# Patient Record
Sex: Female | Born: 1970 | Race: White | Hispanic: No | Marital: Single | Smoking: Never smoker
Health system: Southern US, Community
[De-identification: ages and names within clinical notes are randomized; demographics above are authoritative.]

## PROBLEM LIST (undated history)

## (undated) HISTORY — PX: ANKLE SURGERY: SHX546

## (undated) HISTORY — PX: FOOT SURGERY: SHX648

## (undated) HISTORY — PX: TUBAL LIGATION: SHX77

## (undated) HISTORY — PX: KNEE SURGERY: SHX244

---

## 2005-07-31 ENCOUNTER — Other Ambulatory Visit: Admission: RE | Admit: 2005-07-31 | Discharge: 2005-07-31 | Payer: Self-pay | Admitting: *Deleted

## 2008-01-10 ENCOUNTER — Encounter: Admission: RE | Admit: 2008-01-10 | Discharge: 2008-01-10 | Payer: Self-pay | Admitting: Obstetrics and Gynecology

## 2012-03-30 ENCOUNTER — Other Ambulatory Visit: Payer: Self-pay | Admitting: Obstetrics and Gynecology

## 2012-04-09 ENCOUNTER — Other Ambulatory Visit: Payer: Self-pay

## 2012-04-12 ENCOUNTER — Ambulatory Visit
Admission: RE | Admit: 2012-04-12 | Discharge: 2012-04-12 | Disposition: A | Discharge status: Home / Self Care | Payer: BC Managed Care – PPO | Source: Ambulatory Visit | Attending: Obstetrics and Gynecology | Admitting: Obstetrics and Gynecology

## 2012-04-12 DIAGNOSIS — R928 Other abnormal and inconclusive findings on diagnostic imaging of breast: Secondary | ICD-10-CM

## 2013-04-20 ENCOUNTER — Emergency Department (HOSPITAL_COMMUNITY): Payer: 59

## 2013-04-20 ENCOUNTER — Encounter (HOSPITAL_COMMUNITY): Payer: Self-pay | Admitting: Emergency Medicine

## 2013-04-20 ENCOUNTER — Emergency Department (HOSPITAL_COMMUNITY)
Admission: EM | Admit: 2013-04-20 | Discharge: 2013-04-20 | Disposition: A | Discharge status: Home / Self Care | Payer: 59 | Attending: Emergency Medicine | Admitting: Emergency Medicine

## 2013-04-20 DIAGNOSIS — R109 Unspecified abdominal pain: Secondary | ICD-10-CM | POA: Insufficient documentation

## 2013-04-20 DIAGNOSIS — Z791 Long term (current) use of non-steroidal anti-inflammatories (NSAID): Secondary | ICD-10-CM | POA: Insufficient documentation

## 2013-04-20 DIAGNOSIS — R110 Nausea: Secondary | ICD-10-CM | POA: Insufficient documentation

## 2013-04-20 DIAGNOSIS — Z3202 Encounter for pregnancy test, result negative: Secondary | ICD-10-CM | POA: Insufficient documentation

## 2013-04-20 DIAGNOSIS — R630 Anorexia: Secondary | ICD-10-CM | POA: Insufficient documentation

## 2013-04-20 LAB — CBC WITH DIFFERENTIAL/PLATELET
BASOS PCT: 1 % (ref 0–1)
Basophils Absolute: 0 10*3/uL (ref 0.0–0.1)
Eosinophils Absolute: 0.2 10*3/uL (ref 0.0–0.7)
Eosinophils Relative: 3 % (ref 0–5)
HEMATOCRIT: 42.3 % (ref 36.0–46.0)
HEMOGLOBIN: 14.7 g/dL (ref 12.0–15.0)
LYMPHS ABS: 3 10*3/uL (ref 0.7–4.0)
LYMPHS PCT: 38 % (ref 12–46)
MCH: 30.7 pg (ref 26.0–34.0)
MCHC: 34.8 g/dL (ref 30.0–36.0)
MCV: 88.3 fL (ref 78.0–100.0)
MONO ABS: 0.5 10*3/uL (ref 0.1–1.0)
MONOS PCT: 6 % (ref 3–12)
NEUTROS ABS: 4 10*3/uL (ref 1.7–7.7)
Neutrophils Relative %: 52 % (ref 43–77)
Platelets: 237 10*3/uL (ref 150–400)
RBC: 4.79 MIL/uL (ref 3.87–5.11)
RDW: 13.3 % (ref 11.5–15.5)
WBC: 7.7 10*3/uL (ref 4.0–10.5)

## 2013-04-20 LAB — URINALYSIS, ROUTINE W REFLEX MICROSCOPIC
BILIRUBIN URINE: NEGATIVE
Glucose, UA: NEGATIVE mg/dL
Hgb urine dipstick: NEGATIVE
KETONES UR: NEGATIVE mg/dL
LEUKOCYTES UA: NEGATIVE
NITRITE: NEGATIVE
PROTEIN: NEGATIVE mg/dL
Specific Gravity, Urine: 1.005 (ref 1.005–1.030)
Urobilinogen, UA: 0.2 mg/dL (ref 0.0–1.0)
pH: 6 (ref 5.0–8.0)

## 2013-04-20 LAB — COMPREHENSIVE METABOLIC PANEL
ALK PHOS: 93 U/L (ref 39–117)
ALT: 38 U/L — ABNORMAL HIGH (ref 0–35)
AST: 30 U/L (ref 0–37)
Albumin: 4.5 g/dL (ref 3.5–5.2)
BILIRUBIN TOTAL: 0.3 mg/dL (ref 0.3–1.2)
BUN: 8 mg/dL (ref 6–23)
CHLORIDE: 100 meq/L (ref 96–112)
CO2: 27 meq/L (ref 19–32)
CREATININE: 0.65 mg/dL (ref 0.50–1.10)
Calcium: 10.2 mg/dL (ref 8.4–10.5)
GLUCOSE: 94 mg/dL (ref 70–99)
Potassium: 4.7 mEq/L (ref 3.7–5.3)
Sodium: 141 mEq/L (ref 137–147)
Total Protein: 7.9 g/dL (ref 6.0–8.3)

## 2013-04-20 LAB — POC URINE PREG, ED: Preg Test, Ur: NEGATIVE

## 2013-04-20 LAB — LIPASE, BLOOD: LIPASE: 31 U/L (ref 11–59)

## 2013-04-20 MED ORDER — HYDROCODONE-ACETAMINOPHEN 5-325 MG PO TABS: 1.0000 | ORAL_TABLET | Freq: Four times a day (QID) | ORAL | Status: AC | PRN

## 2013-04-20 MED ORDER — KETOROLAC TROMETHAMINE 60 MG/2ML IM SOLN
60.0000 mg | Freq: Once | INTRAMUSCULAR | Status: AC
  Administered 2013-04-20: 60 mg via INTRAMUSCULAR
  Filled 2013-04-20: qty 2

## 2013-04-20 NOTE — ED Notes (Signed)
Patient given gown to change into upon arrival to room.

## 2013-04-20 NOTE — ED Notes (Signed)
Pt presents to department for evaluation of L sided flank pain radiating to lower back. Also states nausea. 9/10 pain upon arrival. Denies urinary symptoms. Pt is alert and oriented x4.

## 2013-04-20 NOTE — ED Provider Notes (Signed)
CSN: [id number]     Arrival date & time 04/20/13  1622 History   First MD Initiated Contact with Patient 04/20/13 2028     Chief Complaint  Patient presents with  . Flank Pain  . Nausea     (Consider location/radiation/quality/duration/timing/severity/associated sxs/prior Treatment) HPI Comments: Pt driving home from work with sudden sharp L flank pain. No urinary sxs. No hx of stones. No medical problems. Constant, nothing makes better or worse.  Patient is a 43 y.o. female presenting with flank pain. The history is provided by the patient.  Flank Pain This is a new problem. The current episode started yesterday. The problem occurs constantly. The problem has been unchanged. Associated symptoms include abdominal pain, anorexia and nausea. Pertinent negatives include no chest pain, chills, congestion, coughing, diaphoresis, fever, rash or vomiting. Nothing aggravates the symptoms. She has tried nothing for the symptoms. The treatment provided no relief.    History reviewed. No pertinent past medical history. History reviewed. No pertinent past surgical history. No family history on file. History  Substance Use Topics  . Smoking status: Never Smoker   . Smokeless tobacco: Not on file  . Alcohol Use: No   OB History   Grav Para Term Preterm Abortions TAB SAB Ect Mult Living                 Review of Systems  Constitutional: Negative for fever, chills, diaphoresis, activity change and appetite change.  HENT: Negative for congestion and rhinorrhea.   Eyes: Negative for discharge, redness and itching.  Respiratory: Negative for cough, shortness of breath and wheezing.   Cardiovascular: Negative for chest pain and palpitations.  Gastrointestinal: Positive for nausea, abdominal pain and anorexia. Negative for vomiting, diarrhea and constipation.  Genitourinary: Positive for flank pain. Negative for dysuria, hematuria, decreased urine volume and difficulty urinating.  Skin: Negative  for rash.  Neurological: Negative for seizures and syncope.      Allergies  Review of patient's allergies indicates no known allergies.  Home Medications   Current Outpatient Rx  Name  Route  Sig  Dispense  Refill  . naproxen sodium (ANAPROX) 220 MG tablet   Oral   Take 220 mg by mouth 2 (two) times daily with a meal.         . HYDROcodone-acetaminophen (NORCO) 5-325 MG per tablet   Oral   Take 1 tablet by mouth every 6 (six) hours as needed for moderate pain.   15 tablet   0    BP 109/74  Pulse 86  Temp(Src) 97.9 F (36.6 C) (Oral)  Resp 18  Ht 5\' 4"  (1.626 m)  Wt 164 lb (74.39 kg)  BMI 28.14 kg/m2  SpO2 100% Physical Exam  Constitutional: She is oriented to person, place, and time. She appears well-developed and well-nourished. No distress.  HENT:  Head: Normocephalic and atraumatic.  Mouth/Throat: Oropharynx is clear and moist.  Eyes: Conjunctivae and EOM are normal. Pupils are equal, round, and reactive to light. Right eye exhibits no discharge. Left eye exhibits no discharge. No scleral icterus.  Neck: Normal range of motion. Neck supple.  Cardiovascular: Normal rate, regular rhythm and intact distal pulses.  Exam reveals no gallop and no friction rub.   No murmur heard. Pulmonary/Chest: Effort normal and breath sounds normal. No respiratory distress. She has no wheezes. She has no rales.  Abdominal: Soft. She exhibits no distension and no mass. There is tenderness (L CVA TTP, no anterior ttp, soft, no rigidity, neg Mcburneys, neg  Eulah PontMurphy).  Musculoskeletal: Normal range of motion.  Neurological: She is alert and oriented to person, place, and time. No cranial nerve deficit. She exhibits normal muscle tone. Coordination normal.  Skin: She is not diaphoretic.    ED Course  Procedures (including critical care time) Labs Review Labs Reviewed  COMPREHENSIVE METABOLIC PANEL - Abnormal; Notable for the following:    ALT 38 (*)    All other components within  normal limits  CBC WITH DIFFERENTIAL  LIPASE, BLOOD  URINALYSIS, ROUTINE W REFLEX MICROSCOPIC  POC URINE PREG, ED   Imaging Review Ct Abdomen Pelvis Wo Contrast  04/20/2013   CLINICAL DATA:  Left flank pain, left lower quadrant pain  EXAM: CT ABDOMEN AND PELVIS WITHOUT CONTRAST  TECHNIQUE: Multidetector CT imaging of the abdomen and pelvis was performed following the standard protocol without intravenous contrast.  COMPARISON:  None available  FINDINGS: Mild subsegmental atelectasis seen dependently within the lung bases.  Limited noncontrast evaluation of the liver is unremarkable. Gallbladder is within normal limits. No biliary ductal dilatation. The spleen, adrenal glands, and pancreas demonstrate a normal unenhanced appearance.  Kidneys are equal and sinus without evidence of nephrolithiasis or hydronephrosis. No stones seen along the course of either renal collecting system.  No evidence of bowel obstruction. Stomach within normal limits. No abnormal wall thickening or inflammatory fat stranding seen about the bowels. Appendix is well visualized in the right lower quadrant and is of normal caliber and appearance without associated inflammatory changes to suggest acute appendicitis. No significant diverticula.  Bladder is unremarkable.  Uterus and ovaries within normal limits.  No free air or fluid. No enlarged intra-abdominal pelvic lymph nodes minimal atherosclerotic plaque noted about the aortic bifurcation.  No acute osseous abnormality. No worrisome lytic or blastic osseous lesions.  IMPRESSION: 1. No CT evidence of nephrolithiasis or obstructive uropathy identified. 2. No other acute intra-abdominal or pelvic process. No evidence of diverticulitis. Normal appendix.   Electronically Signed   By: Rise MuBenjamin  McClintock M.D.   On: 04/20/2013 21:45     EKG Interpretation None      MDM   MDM:  43 y.o. WF w/ no PMHx w/ ccL: of L flank pain. Yesterday sudden onset in L flank. States radiates to  front. No urinary sxs, no hematuria. Anoxrexia, nausea, no vomtiing or diarrhea. No fever/chills, chest pain, SOB. AFVSS, well appearing here. L CVA TTP, no anterior abd ttp. UA and labs negative. CT Stone study negative. Pt feels better after toradol. Likely low back pain. Recommend NSAIDs, early use of injury. Given rx for Norco. Follow up PCP in 3 days if not better. Discharged. Care of case d/w my attending.  Final diagnoses:  Left flank pain    Discharged   Pilar Jarvisoug Tyvion Edmondson, MD 04/20/13 2239

## 2013-04-20 NOTE — Discharge Instructions (Signed)
Abdominal Pain, Women °Abdominal (stomach, pelvic, or belly) pain can be caused by many things. It is important to tell your doctor: °· The location of the pain. °· Does it come and go or is it present all the time? °· Are there things that start the pain (eating certain foods, exercise)? °· Are there other symptoms associated with the pain (fever, nausea, vomiting, diarrhea)? °All of this is helpful to know when trying to find the cause of the pain. °CAUSES  °· Stomach: virus or bacteria infection, or ulcer. °· Intestine: appendicitis (inflamed appendix), regional ileitis (Crohn's disease), ulcerative colitis (inflamed colon), irritable bowel syndrome, diverticulitis (inflamed diverticulum of the colon), or cancer of the stomach or intestine. °· Gallbladder disease or stones in the gallbladder. °· Kidney disease, kidney stones, or infection. °· Pancreas infection or cancer. °· Fibromyalgia (pain disorder). °· Diseases of the female organs: °· Uterus: fibroid (non-cancerous) tumors or infection. °· Fallopian tubes: infection or tubal pregnancy. °· Ovary: cysts or tumors. °· Pelvic adhesions (scar tissue). °· Endometriosis (uterus lining tissue growing in the pelvis and on the pelvic organs). °· Pelvic congestion syndrome (female organs filling up with blood just before the menstrual period). °· Pain with the menstrual period. °· Pain with ovulation (producing an egg). °· Pain with an IUD (intrauterine device, birth control) in the uterus. °· Cancer of the female organs. °· Functional pain (pain not caused by a disease, may improve without treatment). °· Psychological pain. °· Depression. °DIAGNOSIS  °Your doctor will decide the seriousness of your pain by doing an examination. °· Blood tests. °· X-rays. °· Ultrasound. °· CT scan (computed tomography, special type of X-ray). °· MRI (magnetic resonance imaging). °· Cultures, for infection. °· Barium enema (dye inserted in the large intestine, to better view it with  X-rays). °· Colonoscopy (looking in intestine with a lighted tube). °· Laparoscopy (minor surgery, looking in abdomen with a lighted tube). °· Major abdominal exploratory surgery (looking in abdomen with a large incision). °TREATMENT  °The treatment will depend on the cause of the pain.  °· Many cases can be observed and treated at home. °· Over-the-counter medicines recommended by your caregiver. °· Prescription medicine. °· Antibiotics, for infection. °· Birth control pills, for painful periods or for ovulation pain. °· Hormone treatment, for endometriosis. °· Nerve blocking injections. °· Physical therapy. °· Antidepressants. °· Counseling with a psychologist or psychiatrist. °· Minor or major surgery. °HOME CARE INSTRUCTIONS  °· Do not take laxatives, unless directed by your caregiver. °· Take over-the-counter pain medicine only if ordered by your caregiver. Do not take aspirin because it can cause an upset stomach or bleeding. °· Try a clear liquid diet (broth or water) as ordered by your caregiver. Slowly move to a bland diet, as tolerated, if the pain is related to the stomach or intestine. °· Have a thermometer and take your temperature several times a day, and record it. °· Bed rest and sleep, if it helps the pain. °· Avoid sexual intercourse, if it causes pain. °· Avoid stressful situations. °· Keep your follow-up appointments and tests, as your caregiver orders. °· If the pain does not go away with medicine or surgery, you may try: °· Acupuncture. °· Relaxation exercises (yoga, meditation). °· Group therapy. °· Counseling. °SEEK MEDICAL CARE IF:  °· You notice certain foods cause stomach pain. °· Your home care treatment is not helping your pain. °· You need stronger pain medicine. °· You want your IUD removed. °· You feel faint or   lightheaded. °· You develop nausea and vomiting. °· You develop a rash. °· You are having side effects or an allergy to your medicine. °SEEK IMMEDIATE MEDICAL CARE IF:  °· Your  pain does not go away or gets worse. °· You have a fever. °· Your pain is felt only in portions of the abdomen. The right side could possibly be appendicitis. The left lower portion of the abdomen could be colitis or diverticulitis. °· You are passing blood in your stools (bright red or black tarry stools, with or without vomiting). °· You have blood in your urine. °· You develop chills, with or without a fever. °· You pass out. °MAKE SURE YOU:  °· Understand these instructions. °· Will watch your condition. °· Will get help right away if you are not doing well or get worse. °Document Released: 11/17/2006 Document Revised: 04/14/2011 Document Reviewed: 12/07/2008 °[employer]® Patient Information ©2014 [employer]. ° °

## 2013-04-21 NOTE — ED Provider Notes (Signed)
This patient was seen in conjunction with the resident physician, Dr. Marcha SoldersBrtalik.  The documentation is accurate and reflects the patient's ED course.  On my exam the patient was in no distress.  Evaluation here is largely reassuring.  There is no hemodynamic instability, no suggestion of imminent decompensation.  Patient was discharged in stable condition.  Morgan Munchobert Santosh Petter, MD 04/21/13 [phone]

## 2014-11-24 IMAGING — CT CT ABD-PELV W/O CM
2 of 4 series · 16 of 46 positions shown, 18 images · non-contrast
Comparison: None available

CLINICAL DATA: Left flank pain, left lower quadrant pain

EXAM:
CT ABDOMEN AND PELVIS WITHOUT CONTRAST
TECHNIQUE: Multidetector CT imaging of the abdomen and pelvis was performed
following the standard protocol without intravenous contrast.

[Series 2: stone study 5.0 i30f 1 · axial · 0.71mm/px · z∈[+740,+1175]mm · 13 of 95 slices shown, 15 images]
[im 4/95  soft-tissue]
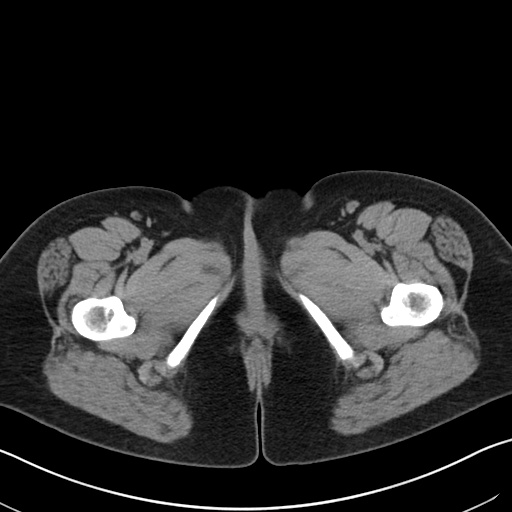
[im 4/95  bone]
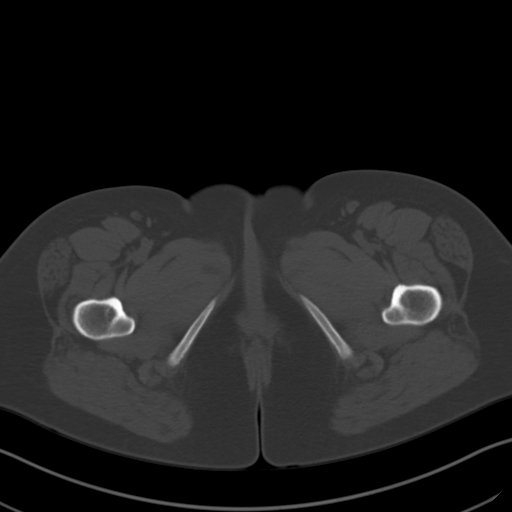
[im 12/95  soft-tissue]
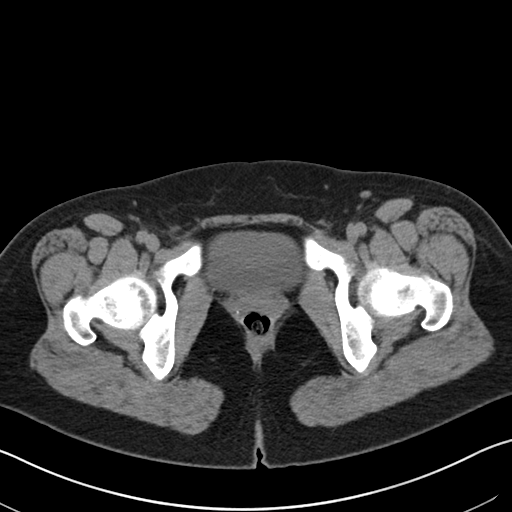
[im 20/95  soft-tissue]
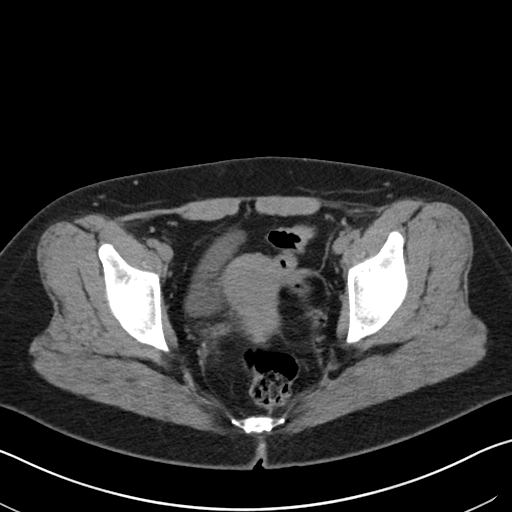
[im 28/95  soft-tissue]
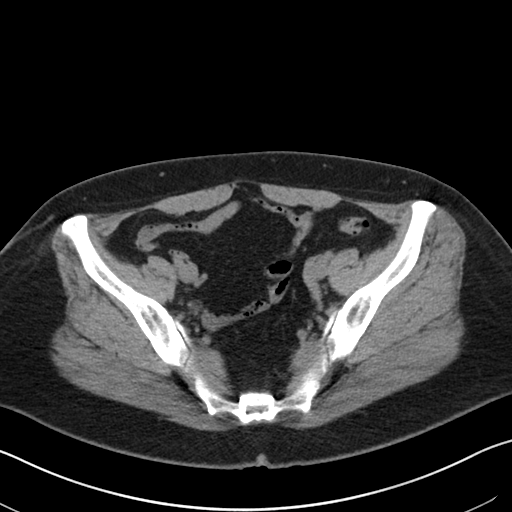
[im 32/95  soft-tissue]
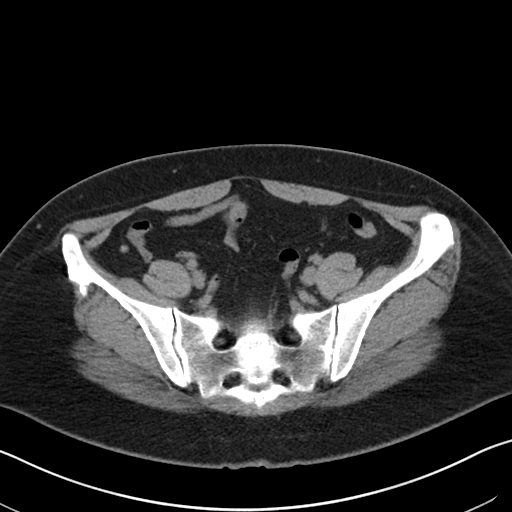
[im 40/95  soft-tissue]
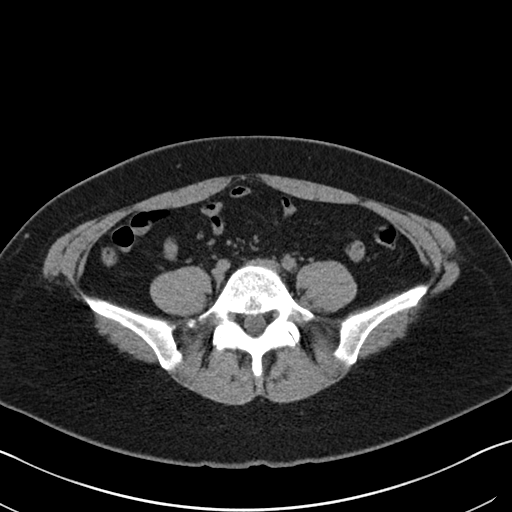
[im 48/95  soft-tissue]
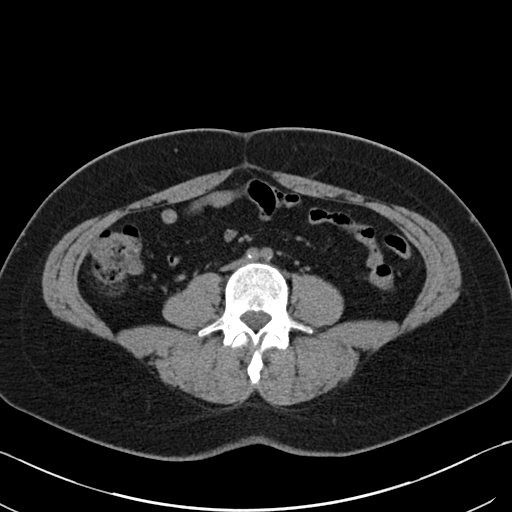
[im 55/95  soft-tissue]
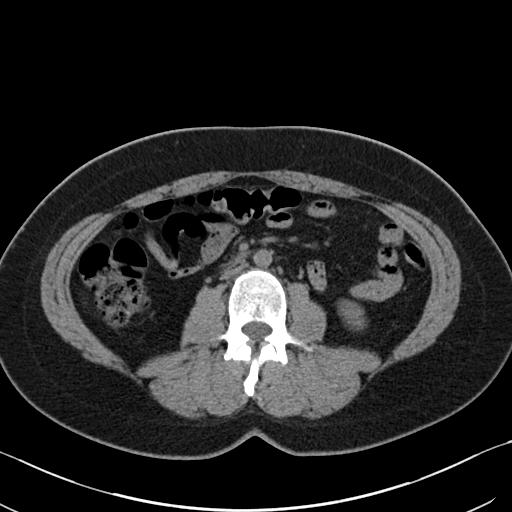
[im 63/95  soft-tissue]
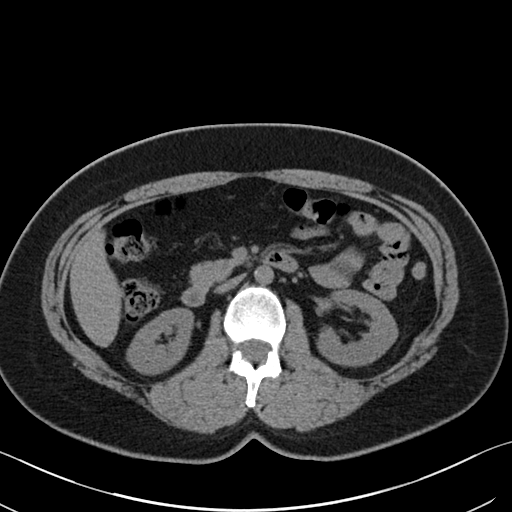
[im 63/95  bone]
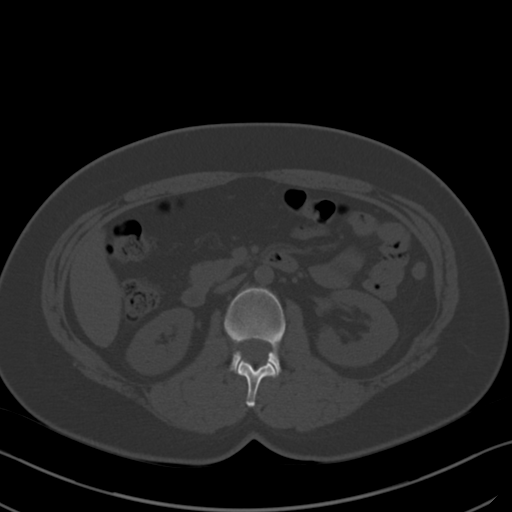
[im 67/95  soft-tissue]
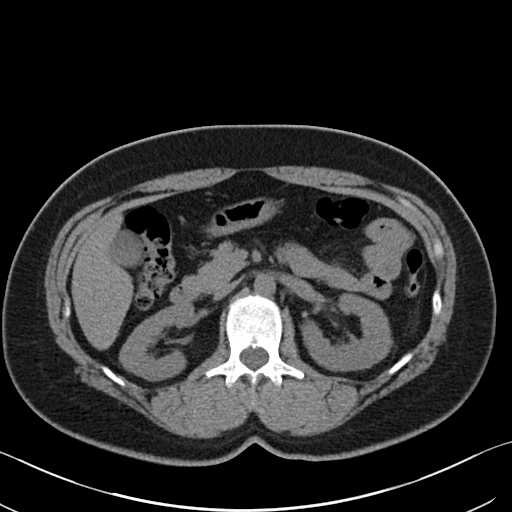
[im 75/95  soft-tissue]
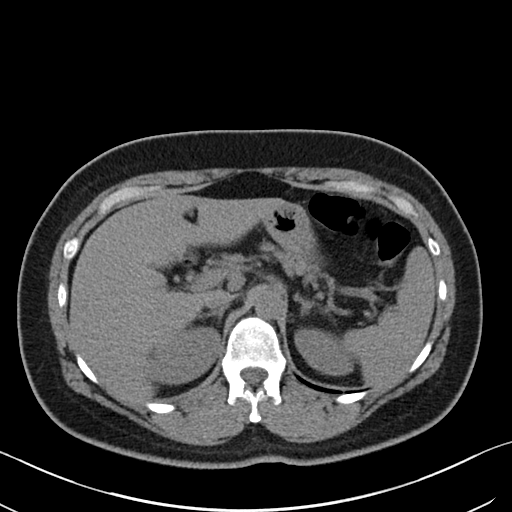
[im 83/95  soft-tissue]
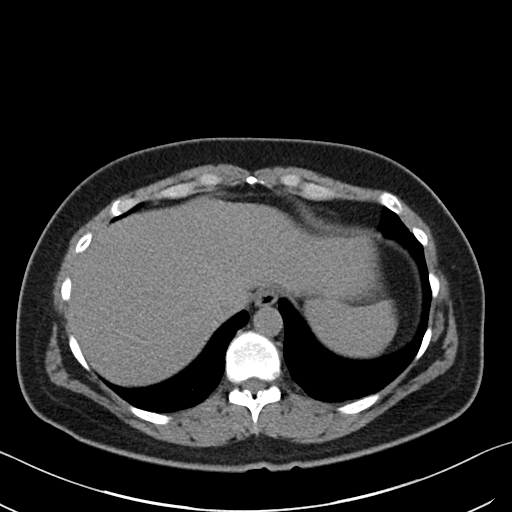
[im 91/95  soft-tissue]
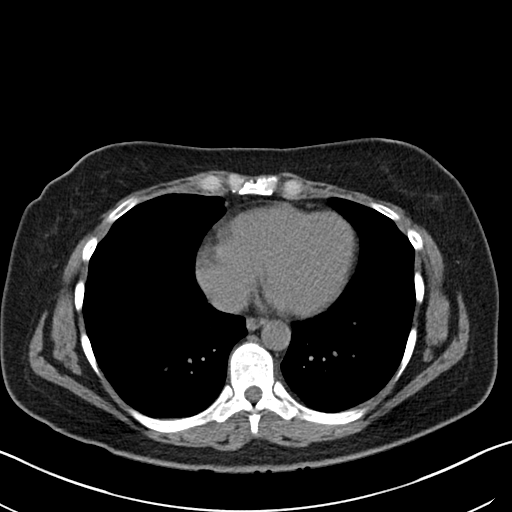

[Series 5: coronal soft tissue · coronal · 0.77mm/px · 3 of 65 slices shown]
[im 22/65  soft-tissue]
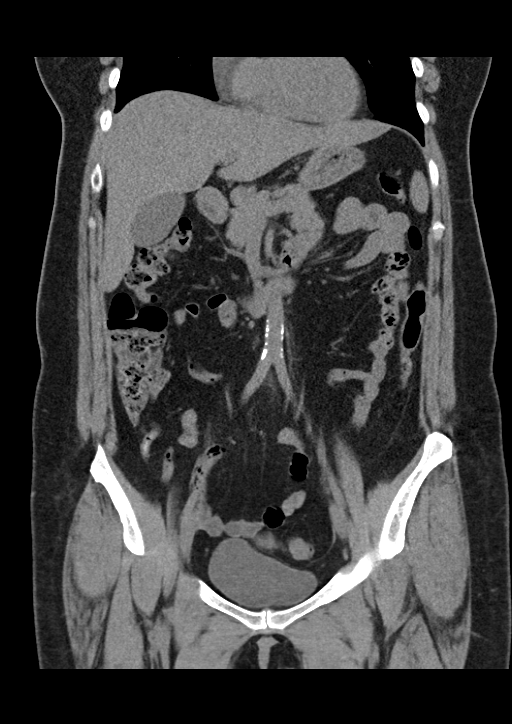
[im 29/65  soft-tissue]
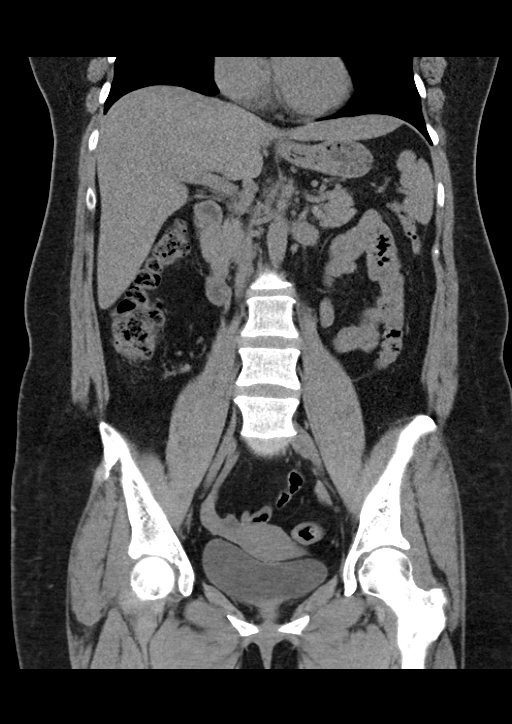
[im 36/65  soft-tissue]
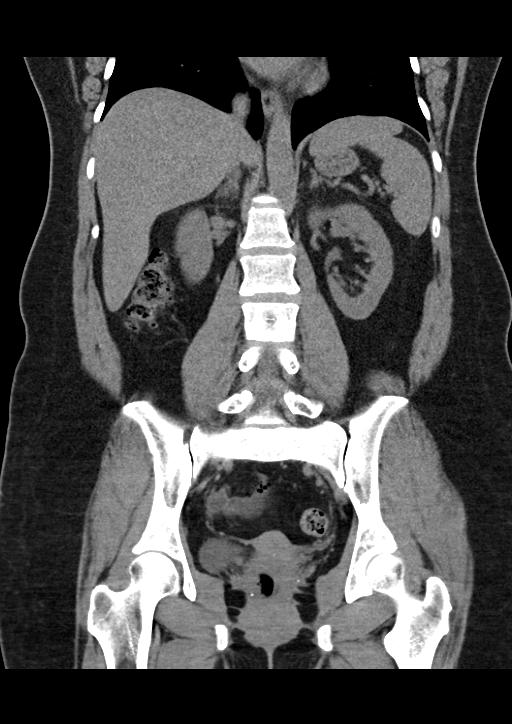

[16 of 46 positions shown; findings below may reference images not displayed]

FINDINGS: Mild subsegmental atelectasis seen dependently within the lung
bases.

Limited noncontrast evaluation of the liver is unremarkable.
Gallbladder is within normal limits. No biliary ductal dilatation.
The spleen, adrenal glands, and pancreas demonstrate a normal
unenhanced appearance.

Kidneys are equal and sinus without evidence of nephrolithiasis or
hydronephrosis. No stones seen along the course of either renal
collecting system.

No evidence of bowel obstruction. Stomach within normal limits. No
abnormal wall thickening or inflammatory fat stranding seen about
the bowels. Appendix is well visualized in the right lower quadrant
and is of normal caliber and appearance without associated
inflammatory changes to suggest acute appendicitis. No significant
diverticula.

Bladder is unremarkable.  Uterus and ovaries within normal limits.

No free air or fluid. No enlarged intra-abdominal pelvic lymph nodes
minimal atherosclerotic plaque noted about the aortic bifurcation.

No acute osseous abnormality. No worrisome lytic or blastic osseous
lesions.
IMPRESSION: 1. No CT evidence of nephrolithiasis or obstructive uropathy
identified.
2. No other acute intra-abdominal or pelvic process. No evidence of
diverticulitis. Normal appendix.

## 2015-05-02 ENCOUNTER — Other Ambulatory Visit: Payer: Self-pay | Admitting: Obstetrics and Gynecology

## 2015-05-02 DIAGNOSIS — N6489 Other specified disorders of breast: Secondary | ICD-10-CM

## 2015-05-08 ENCOUNTER — Ambulatory Visit
Admission: RE | Admit: 2015-05-08 | Discharge: 2015-05-08 | Disposition: A | Discharge status: Home / Self Care | Payer: BLUE CROSS/BLUE SHIELD | Source: Ambulatory Visit | Attending: Obstetrics and Gynecology | Admitting: Obstetrics and Gynecology

## 2015-05-08 DIAGNOSIS — N6489 Other specified disorders of breast: Secondary | ICD-10-CM

## 2015-05-23 ENCOUNTER — Encounter: Payer: Self-pay | Admitting: Podiatry

## 2015-05-23 ENCOUNTER — Ambulatory Visit (INDEPENDENT_AMBULATORY_CARE_PROVIDER_SITE_OTHER): Payer: BLUE CROSS/BLUE SHIELD

## 2015-05-23 ENCOUNTER — Ambulatory Visit (INDEPENDENT_AMBULATORY_CARE_PROVIDER_SITE_OTHER): Payer: BLUE CROSS/BLUE SHIELD | Admitting: Podiatry

## 2015-05-23 VITALS — BP 122/86 | HR 74 | Resp 16

## 2015-05-23 DIAGNOSIS — M21619 Bunion of unspecified foot: Secondary | ICD-10-CM

## 2015-05-23 DIAGNOSIS — M79673 Pain in unspecified foot: Secondary | ICD-10-CM | POA: Diagnosis not present

## 2015-05-23 DIAGNOSIS — M2041 Other hammer toe(s) (acquired), right foot: Secondary | ICD-10-CM

## 2015-05-23 DIAGNOSIS — Q786 Multiple congenital exostoses: Secondary | ICD-10-CM

## 2015-05-23 DIAGNOSIS — M898X0 Other specified disorders of bone, multiple sites: Secondary | ICD-10-CM

## 2015-05-23 MED ORDER — TERBINAFINE HCL 250 MG PO TABS: ORAL_TABLET | ORAL | Status: AC

## 2015-05-23 NOTE — Progress Notes (Signed)
Subjective:     Patient ID: Morgan GanongJennifer Ned, female   DOB: 11-Jan-1971, 45 y.o.   MRN: [id number]  HPI patient presents with painful bunion on the left foot that's been bothering her more extensively over the last 6 months and a painful keratotic lesion between the fourth and fifth toes right that has been bothering her for around a year with lesion also on the outside fifth digit right. States she's tried wider shoes she's tried soaks and padding without relief of symptoms   Review of Systems  All other systems reviewed and are negative.      Objective:   Physical Exam  Constitutional: She is oriented to person, place, and time.  Cardiovascular: Intact distal pulses.   Musculoskeletal: Normal range of motion.  Neurological: She is oriented to person, place, and time.  Skin: Skin is warm.  Nursing note and vitals reviewed.  neurovascular status intact muscle strength adequate range of motion within normal limits with patient found to have hyperostosis medial aspect first metatarsal head left it's painful and redness also around the joint surface. Right fifth toe shows rotation with distal medial exostotic lesion and enlargement had a proximal phalanx. Patient has pain with these areas with keratotic tissue formation and is unable to wear shoe gear without difficulty. 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 Discussed at great length condition and conservative and surgical treatments available. Patient is opted for surgical intervention at this point I have recommended structural bunion correction left and hammertoe repair fifth right along with distal medial exostectomy. She wants to review consent form today she wants to get it done fairly soon and I allowed patient to read consent form reviewing all  possible alternative treatments and complications. She understands is no guarantee as far success of surgery and understands everything as outlined in the consent form and risk associated with it. I went ahead and dispensed air fracture walker for the left with instructions on getting used to prior to procedure and scheduled for outpatient surgery with patient being encouraged to call with any questions  X-ray report indicates indicates elevation of the 1 2 intermetatarsal angle left of approximately 14 and rotation fifth toe right with distal medial exostotic lesion

## 2015-05-23 NOTE — Progress Notes (Signed)
   Subjective:    Patient ID: Morgan Huang, female    DOB: [birth year], 45 y.o.   MRN: [id number]  HPI    Review of Systems  Constitutional: Positive for diaphoresis and fatigue.  Musculoskeletal: Positive for arthralgias and gait problem.  All other systems reviewed and are negative.      Objective:   Physical Exam        Assessment & Plan:

## 2015-05-23 NOTE — Patient Instructions (Signed)
Pre-Operative Instructions  Congratulations, you have decided to take an important step to improving your quality of life.  You can be assured that the doctors of [facility] will be with you every step of the way.  1. Plan to be at the surgery center/hospital at least 1 (one) hour prior to your scheduled time unless otherwise directed by the surgical center/hospital staff.  You must have a responsible adult accompany you, remain during the surgery and drive you home.  Make sure you have directions to the surgical center/hospital and know how to get there on time. 2. For hospital based surgery you will need to obtain a history and physical form from your family physician within 1 month prior to the date of surgery- we will give you a form for you primary physician.  3. We make every effort to accommodate the date you request for surgery.  There are however, times where surgery dates or times have to be moved.  We will contact you as soon as possible if a change in schedule is required.   4. No Aspirin/Ibuprofen for one week before surgery.  If you are on aspirin, any non-steroidal anti-inflammatory medications (Mobic, Aleve, Ibuprofen) you should stop taking it 7 days prior to your surgery.  You make take Tylenol  For pain prior to surgery.  5. Medications- If you are taking daily heart and blood pressure medications, seizure, reflux, allergy, asthma, anxiety, pain or diabetes medications, make sure the surgery center/hospital is aware before the day of surgery so they may notify you which medications to take or avoid the day of surgery. 6. No food or drink after midnight the night before surgery unless directed otherwise by surgical center/hospital staff. 7. No alcoholic beverages 24 hours prior to surgery.  No smoking 24 hours prior to or 24 hours after surgery. 8. Wear loose pants or shorts- loose enough to fit over bandages, boots, and casts. 9. No slip on shoes, sneakers are best. 10. Bring  your boot with you to the surgery center/hospital.  Also bring crutches or a walker if your physician has prescribed it for you.  If you do not have this equipment, it will be provided for you after surgery. 11. If you have not been contracted by the surgery center/hospital by the day before your surgery, call to confirm the date and time of your surgery. 12. Leave-time from work may vary depending on the type of surgery you have.  Appropriate arrangements should be made prior to surgery with your employer. 13. Prescriptions will be provided immediately following surgery by your doctor.  Have these filled as soon as possible after surgery and take the medication as directed. 14. Remove nail polish on the operative foot. 15. Wash the night before surgery.  The night before surgery wash the foot and leg well with the antibacterial soap provided and water paying special attention to beneath the toenails and in between the toes.  Rinse thoroughly with water and dry well with a towel.  Perform this wash unless told not to do so by your physician.  Enclosed: 1 Ice pack (please put in freezer the night before surgery)   1 Hibiclens skin cleaner   Pre-op Instructions  If you have any questions regarding the instructions, do not hesitate to call our office.  [place]: [address] [place], [place] [address] [phone]  [place]: [address] [place], [place] [address] [phone]  [place]: [address]  [place], [place] [address] [phone]  Dr. Richard   Tuchman DPM, Dr. Norman Regal DPM Dr. Richard Sikora DPM, Dr. M. Todd Hyatt DPM, Dr. Kathryn Egerton DPM 

## 2015-06-05 ENCOUNTER — Encounter: Payer: Self-pay | Admitting: Podiatry

## 2015-06-05 DIAGNOSIS — M2012 Hallux valgus (acquired), left foot: Secondary | ICD-10-CM | POA: Diagnosis not present

## 2015-06-05 DIAGNOSIS — M2041 Other hammer toe(s) (acquired), right foot: Secondary | ICD-10-CM | POA: Diagnosis not present

## 2015-06-05 DIAGNOSIS — M25774 Osteophyte, right foot: Secondary | ICD-10-CM | POA: Diagnosis not present

## 2015-06-06 ENCOUNTER — Telehealth: Payer: Self-pay | Admitting: *Deleted

## 2015-06-06 NOTE — Telephone Encounter (Signed)
Post op courtesy call-Pt states she's doing fine.  I instructed pt not to dangle or be up on the surgical foot more than 15 mins/hour, leave the surgical boot on even to sleep and walk, but may open the and rotate the foot gently if resting, leave the original surgical dressing in place until the 1st POV, take pain medications as directed, and call with concerns. Pt states understanding.

## 2015-06-15 ENCOUNTER — Ambulatory Visit (INDEPENDENT_AMBULATORY_CARE_PROVIDER_SITE_OTHER): Payer: BLUE CROSS/BLUE SHIELD | Admitting: Podiatry

## 2015-06-15 ENCOUNTER — Ambulatory Visit (INDEPENDENT_AMBULATORY_CARE_PROVIDER_SITE_OTHER): Payer: BLUE CROSS/BLUE SHIELD

## 2015-06-15 ENCOUNTER — Encounter: Payer: Self-pay | Admitting: Podiatry

## 2015-06-15 VITALS — Temp 99.2°F

## 2015-06-15 DIAGNOSIS — Z9889 Other specified postprocedural states: Secondary | ICD-10-CM

## 2015-06-15 DIAGNOSIS — M21619 Bunion of unspecified foot: Secondary | ICD-10-CM

## 2015-06-15 DIAGNOSIS — M2041 Other hammer toe(s) (acquired), right foot: Secondary | ICD-10-CM

## 2015-06-15 MED ORDER — OXYCODONE-ACETAMINOPHEN 10-325 MG PO TABS: 1.0000 | ORAL_TABLET | Freq: Four times a day (QID) | ORAL | Status: AC | PRN

## 2015-06-15 NOTE — Progress Notes (Signed)
Subjective:     Patient ID: Morgan GanongJennifer Huang, female   DOB: 15-Feb-1970, 45 y.o.   MRN: [id number]  HPI patient states I'm doing well with my feet with able to walk on them without much discomfort and wearing my boot on the left and the surgical shoe on the right   Review of Systems     Objective:   Physical Exam Neurovascular status intact muscle strength adequate negative Homans sign noted with patient having well structured first MPJ left with hallux in rectus position and good alignment of the fifth digit right with stitches intact    Assessment:     Doing well post surgical intervention of both feet    Plan:     H&P and x-rays reviewed of both feet. Today I went ahead and have recommended continued immobilization elevation and reapplied sterile dressing and patient be seen back 2 weeks for suture removal or earlier if needed  X-ray report indicated osteotomy left is healing very well with good alignment and the digit fifth right had satisfactory section of bone with good alignment

## 2015-06-29 ENCOUNTER — Ambulatory Visit (INDEPENDENT_AMBULATORY_CARE_PROVIDER_SITE_OTHER): Payer: BLUE CROSS/BLUE SHIELD

## 2015-06-29 ENCOUNTER — Encounter: Payer: Self-pay | Admitting: Podiatry

## 2015-06-29 ENCOUNTER — Ambulatory Visit (INDEPENDENT_AMBULATORY_CARE_PROVIDER_SITE_OTHER): Payer: BLUE CROSS/BLUE SHIELD | Admitting: Podiatry

## 2015-06-29 ENCOUNTER — Ambulatory Visit: Payer: BLUE CROSS/BLUE SHIELD

## 2015-06-29 DIAGNOSIS — M2041 Other hammer toe(s) (acquired), right foot: Secondary | ICD-10-CM

## 2015-06-29 DIAGNOSIS — Z9889 Other specified postprocedural states: Secondary | ICD-10-CM

## 2015-06-29 DIAGNOSIS — M21619 Bunion of unspecified foot: Secondary | ICD-10-CM

## 2015-07-01 NOTE — Progress Notes (Signed)
Subjective:     Patient ID: Morgan GanongJennifer Sally, female   DOB: 05-30-1970, 45 y.o.   MRN: [id number]  HPI patient states she's doing well with good structural alignment first MPJ left and good digital repair fifth digit  right   Review of Systems     Objective:   Physical Exam Neurovascular status intact muscle strength adequate range of motion within normal limits with patient found to have well-healing surgical site left and good digital repair with stitches intact fifth right    Assessment:     Doing well post osteotomy left hammertoe repair    Plan:     X-rays reviewed and based on response and allow her to gradual return soft shoe gear. Stitches removed left fifth digit and digits found to be in good alignment and may slowly return to soft shoes  X-ray report indicates pins are in place left with good digital correction fifth right with good alignment

## 2015-07-25 ENCOUNTER — Ambulatory Visit (INDEPENDENT_AMBULATORY_CARE_PROVIDER_SITE_OTHER): Payer: BLUE CROSS/BLUE SHIELD

## 2015-07-25 ENCOUNTER — Ambulatory Visit (INDEPENDENT_AMBULATORY_CARE_PROVIDER_SITE_OTHER): Payer: BLUE CROSS/BLUE SHIELD | Admitting: Podiatry

## 2015-07-25 DIAGNOSIS — M2041 Other hammer toe(s) (acquired), right foot: Secondary | ICD-10-CM | POA: Diagnosis not present

## 2015-07-25 DIAGNOSIS — Z9889 Other specified postprocedural states: Secondary | ICD-10-CM

## 2015-07-25 DIAGNOSIS — M21619 Bunion of unspecified foot: Secondary | ICD-10-CM | POA: Diagnosis not present

## 2015-07-25 NOTE — Progress Notes (Signed)
Subjective:     Patient ID: Morgan GanongJennifer Borrero, female   DOB: [birth year], 45 y.o.   MRN: [id number]  HPI patient states I'm doing real well with both my feet with good correction and no indications of problems with walking   Review of Systems     Objective:   Physical Exam Neurovascular status intact muscle strength adequate with patient having good alignment of the first MPJ and good correction of the fifth digit    Assessment:     Doing well post surgery bilateral    Plan:     Reviewed condition and recommended anti-inflammatories and fragile increase in activity and reviewed x-rays with patient  X-rays indicated satisfactory resection of head of proximal phalanx fifth digit and good correction of structural bunion with pins in place and good alignment noted

## 2015-07-27 ENCOUNTER — Other Ambulatory Visit: Payer: BLUE CROSS/BLUE SHIELD

## 2015-09-26 ENCOUNTER — Ambulatory Visit (INDEPENDENT_AMBULATORY_CARE_PROVIDER_SITE_OTHER): Payer: BLUE CROSS/BLUE SHIELD

## 2015-09-26 ENCOUNTER — Ambulatory Visit (INDEPENDENT_AMBULATORY_CARE_PROVIDER_SITE_OTHER): Payer: BLUE CROSS/BLUE SHIELD | Admitting: Podiatry

## 2015-09-26 DIAGNOSIS — M21619 Bunion of unspecified foot: Secondary | ICD-10-CM

## 2015-09-26 DIAGNOSIS — M2041 Other hammer toe(s) (acquired), right foot: Secondary | ICD-10-CM

## 2015-09-26 DIAGNOSIS — Z9889 Other specified postprocedural states: Secondary | ICD-10-CM

## 2015-09-26 NOTE — Progress Notes (Signed)
Subjective:     Patient ID: Morgan Huang, female   DOB: 1970/07/20, 45 y.o.   MRN: [id number]  HPI patient states I'm doing really well with mild discomfort if I have been on my feet for too long especially the left foot   Review of Systems     Objective:   Physical Exam Neurovascular status intact muscle strength adequate with good range of motion first MPJ left with wound edges well coapted and good alignment of the big toe. The right fifth toe is healing well    Assessment:     Doing well structural correction of the left foot and right foot    Plan:     H&P x-rays reviewed and advised on returning to normal activity. Patient will be seen back as needed and was encouraged to call with any questions  X-ray report indicates excellent healing of the osteotomy with bone in good alignment and pins in place and good resection bone fifth right

## 2015-10-01 NOTE — Progress Notes (Signed)
DOS 06/05/2015 [place] bunionectomy (cutting and removing bone), Exostectomy 5th right, arthoroplasty w/removal bone 5th right.

## 2016-05-13 ENCOUNTER — Other Ambulatory Visit: Payer: Self-pay | Admitting: Obstetrics and Gynecology

## 2016-05-13 DIAGNOSIS — Z1231 Encounter for screening mammogram for malignant neoplasm of breast: Secondary | ICD-10-CM

## 2016-06-02 ENCOUNTER — Ambulatory Visit: Payer: BLUE CROSS/BLUE SHIELD

## 2016-06-18 ENCOUNTER — Ambulatory Visit
Admission: RE | Admit: 2016-06-18 | Discharge: 2016-06-18 | Disposition: A | Discharge status: Home / Self Care | Payer: BLUE CROSS/BLUE SHIELD | Source: Ambulatory Visit | Attending: Obstetrics and Gynecology | Admitting: Obstetrics and Gynecology

## 2016-06-18 DIAGNOSIS — Z1231 Encounter for screening mammogram for malignant neoplasm of breast: Secondary | ICD-10-CM

## 2016-06-19 ENCOUNTER — Other Ambulatory Visit: Payer: Self-pay | Admitting: Obstetrics and Gynecology

## 2016-06-19 DIAGNOSIS — Q859 Phakomatosis, unspecified: Secondary | ICD-10-CM

## 2016-06-20 ENCOUNTER — Ambulatory Visit
Admission: RE | Admit: 2016-06-20 | Discharge: 2016-06-20 | Disposition: A | Discharge status: Home / Self Care | Payer: BLUE CROSS/BLUE SHIELD | Source: Ambulatory Visit | Attending: Obstetrics and Gynecology | Admitting: Obstetrics and Gynecology

## 2016-06-20 DIAGNOSIS — Q859 Phakomatosis, unspecified: Secondary | ICD-10-CM

## 2018-09-09 ENCOUNTER — Other Ambulatory Visit: Payer: Self-pay

## 2018-09-09 ENCOUNTER — Emergency Department (HOSPITAL_BASED_OUTPATIENT_CLINIC_OR_DEPARTMENT_OTHER)
Admission: EM | Admit: 2018-09-09 | Discharge: 2018-09-09 | Disposition: A | Discharge status: Home / Self Care | Payer: BC Managed Care – PPO | Attending: Emergency Medicine | Admitting: Emergency Medicine

## 2018-09-09 ENCOUNTER — Encounter (HOSPITAL_BASED_OUTPATIENT_CLINIC_OR_DEPARTMENT_OTHER): Payer: Self-pay | Admitting: *Deleted

## 2018-09-09 DIAGNOSIS — R51 Headache: Secondary | ICD-10-CM | POA: Insufficient documentation

## 2018-09-09 DIAGNOSIS — H538 Other visual disturbances: Secondary | ICD-10-CM | POA: Diagnosis not present

## 2018-09-09 DIAGNOSIS — Z79899 Other long term (current) drug therapy: Secondary | ICD-10-CM | POA: Diagnosis not present

## 2018-09-09 DIAGNOSIS — J019 Acute sinusitis, unspecified: Secondary | ICD-10-CM | POA: Diagnosis not present

## 2018-09-09 DIAGNOSIS — R519 Headache, unspecified: Secondary | ICD-10-CM

## 2018-09-09 LAB — SEDIMENTATION RATE: Sed Rate: 8 mm/hr (ref 0–22)

## 2018-09-09 MED ORDER — KETOROLAC TROMETHAMINE 15 MG/ML IJ SOLN
15.0000 mg | Freq: Once | INTRAMUSCULAR | Status: AC
  Administered 2018-09-09: 22:00:00 15 mg via INTRAVENOUS
  Filled 2018-09-09: qty 1

## 2018-09-09 MED ORDER — PROCHLORPERAZINE EDISYLATE 10 MG/2ML IJ SOLN
10.0000 mg | Freq: Once | INTRAMUSCULAR | Status: AC
  Administered 2018-09-09: 22:00:00 10 mg via INTRAVENOUS
  Filled 2018-09-09: qty 2

## 2018-09-09 MED ORDER — SODIUM CHLORIDE 0.9 % IV BOLUS
1000.0000 mL | Freq: Once | INTRAVENOUS | Status: AC
  Administered 2018-09-09: 22:00:00 1000 mL via INTRAVENOUS

## 2018-09-09 MED ORDER — DIPHENHYDRAMINE HCL 50 MG/ML IJ SOLN
12.5000 mg | Freq: Once | INTRAMUSCULAR | Status: AC
  Administered 2018-09-09: 12.5 mg via INTRAVENOUS
  Filled 2018-09-09: qty 1

## 2018-09-09 MED ORDER — OXYMETAZOLINE HCL 0.05 % NA SOLN
1.0000 | Freq: Once | NASAL | Status: AC
  Administered 2018-09-09: 1 via NASAL
  Filled 2018-09-09: qty 30

## 2018-09-09 NOTE — ED Notes (Signed)
ED Provider at bedside. 

## 2018-09-09 NOTE — ED Triage Notes (Signed)
Headache for a week. Hx of migraines. She was seen at [facility] and diagnosed with sinusitis. She has been taking Sudafed with no relief. Pain is worse behind her left eye, jaw and ear.

## 2018-09-09 NOTE — ED Provider Notes (Signed)
[facility] Provider Note   CSN: [id number] Arrival date & time: 09/09/18  2104    History   Chief Complaint No chief complaint on file.   HPI Morgan Huang is a 48 y.o. female with no significant past medical history.  Patient presents with 7 days of on-and-off left-sided headache.  She states that 5 days ago, she went to urgent care due to severe pain.  She was diagnosed with a sinus infection and sent home.  She was told to use Sudafed as needed.  She is continue to use Sudafed, but had acute worsening of headache today.  Notes that in the last hour or so, she has had worsening pain on the left side of her head, mostly around her left eye into her left forehead and left jaw.  She denies jaw claudication.  Notes mild blurry vision in the left eye associated with headache.  Denies weakness, numbness tingling of extremities.  Denies nausea.  Endorses photophobia.  Has noted some congestion and notes fullness over her left cheek.  She took Tylenol about 45 minutes ago without improvement.  She reports a history of migraines, but states that this was not similar to those.  She denies any fevers, sick contacts.  Patient reports a negative COVID test at urgent care.      History reviewed. No pertinent past medical history.  There are no active problems to display for this patient.   Past Surgical History:  Procedure Laterality Date  . ANKLE SURGERY    . CESAREAN SECTION    . FOOT SURGERY    . KNEE SURGERY    . TUBAL LIGATION       OB History   No obstetric history on file.      Home Medications    Prior to Admission medications   Medication Sig Start Date End Date Taking? Authorizing Provider  ALPRAZolam Duanne Moron) 0.5 MG tablet  08/12/18   [provider]  citalopram (CELEXA) 10 MG tablet  08/14/18   [provider]  HYDROcodone-acetaminophen (NORCO) 5-325 MG per tablet Take 1 tablet by mouth every 6 (six) hours as needed for  moderate pain. Patient not taking: Reported on 06/29/2015 04/20/13   Sol Passer, MD  naproxen sodium (ANAPROX) 220 MG tablet Take 220 mg by mouth 2 (two) times daily with a meal. Reported on 06/29/2015    [provider]  oxyCODONE-acetaminophen (PERCOCET) 10-325 MG tablet Take 1 tablet by mouth every 6 (six) hours as needed for pain. 06/15/15   Wallene Huh, DPM  terbinafine (LAMISIL) 250 MG tablet Take one tablet once daily x 7 days, then repeat every month x 4 months Patient not taking: Reported on 06/29/2015 05/23/15   Wallene Huh, DPM  topiramate (TOPAMAX) 25 MG tablet  08/12/18   [provider]    Family History No family history on file.  Social History Social History   Tobacco Use  . Smoking status: Never Smoker  . Smokeless tobacco: Never Used  Substance Use Topics  . Alcohol use: No  . Drug use: No     Allergies   Patient has no known allergies.   Review of Systems Review of Systems  Constitutional: Negative for fatigue and fever.  HENT: Positive for congestion and sinus pain. Negative for sore throat.   Eyes: Positive for visual disturbance (left eye blurry vision with pain). Negative for pain and redness.  Respiratory: Negative for cough and shortness of breath.   Cardiovascular:  Negative for chest pain.  Gastrointestinal: Negative for abdominal pain.  Genitourinary: Negative for difficulty urinating.  Musculoskeletal: Negative for neck pain.       No extremity weakness  Neurological: Positive for headaches. Negative for dizziness, weakness and numbness.     Physical Exam Updated Vital Signs BP (!) 153/100   Pulse 97   Temp 98.5 F (36.9 C) (Oral)   Resp 20   Ht 5' 4"  (1.626 m)   Wt 77.1 kg   SpO2 97%   BMI 29.18 kg/m   Physical Exam Constitutional:      Comments: Appears to be in pain  HENT:     Head: Normocephalic and atraumatic.     Comments: TTP frontal and maxillary sinus on left, diffuse tenderness to palpation of  left temple and TMJ, 2+ temporal artery pulse    Nose: Congestion present.     Mouth/Throat:     Mouth: Mucous membranes are dry.     Pharynx: No posterior oropharyngeal erythema.  Eyes:     Extraocular Movements: Extraocular movements intact.     Conjunctiva/sclera: Conjunctivae normal.     Pupils: Pupils are equal, round, and reactive to light.  Cardiovascular:     Rate and Rhythm: Normal rate and regular rhythm.     Heart sounds: No murmur.  Pulmonary:     Effort: Pulmonary effort is normal. No respiratory distress.     Breath sounds: Normal breath sounds. No wheezing.  Musculoskeletal: Normal range of motion.  Skin:    General: Skin is warm and dry.  Neurological:     Mental Status: She is alert. Mental status is at baseline.     Sensory: No sensory deficit.     Motor: No weakness.      ED Treatments / Results  Labs (all labs ordered are listed, but only abnormal results are displayed) Labs Reviewed  SEDIMENTATION RATE    EKG None  Radiology No results found.  Procedures Procedures (including critical care time)  Medications Ordered in ED Medications  sodium chloride 0.9 % bolus 1,000 mL (0 mLs Intravenous Stopped 09/09/18 2240)  oxymetazoline (AFRIN) 0.05 % nasal spray 1 spray (1 spray Each Nare Given 09/09/18 2154)  ketorolac (TORADOL) 15 MG/ML injection 15 mg (15 mg Intravenous Given 09/09/18 2153)  prochlorperazine (COMPAZINE) injection 10 mg (10 mg Intravenous Given 09/09/18 2153)  diphenhydrAMINE (BENADRYL) injection 12.5 mg (12.5 mg Intravenous Given 09/09/18 2153)     Initial Impression / Assessment and Plan / ED Course  I have reviewed the triage vital signs and the nursing notes.  Pertinent labs & imaging results that were available during my care of the patient were reviewed by me and considered in my medical decision making (see chart for details).  [name] is a 48 year old female with no significant past medical history who presents with left-sided  headache.  Given tenderness to palpation of maxillary and frontal sinus, suspect sinusitis.  Patient also with congestion with lungs to this diagnosis.  Blood pressure mildly elevated, likely secondary to pain.  Patient does have tenderness to palpation of temple area, but has good temporal artery pulse.  Given her age, and location of headache with some blurry vision in her left eye, will obtain sed rate to rule out giant cell arteritis.  She has neurologically intact on exam which is reassuring.  We will give fluids, Afrin, migraine cocktail and reassess.  Upon reassessment after fluids, Afrin, and migraine cocktail, patient was 0 out of 10 pain.  No visual changes.  Discussed with patient that ESR will take time to come back, she agreed to follow-up with PCP for results.  She was given appropriate return precautions including worsening of pain, changes in vision, numbness tingling, extremity weakness, she voiced understanding.  She was discharged with follow-up to PCP.  PCP to follow-up on blood pressure, will likely improve outside of setting of acute pain.  Patient advised to obtain Afrin from pharmacy.    Final Clinical Impressions(s) / ED Diagnoses   Final diagnoses:  Sinus headache    ED Discharge Orders    None       Cleophas Dunker, DO 09/09/18 2243    Isla Pence, MD 09/09/18 2245

## 2018-09-09 NOTE — Discharge Instructions (Signed)
You were seen for headache, this is likely caused by sinus pain.  You can get some Afrin over-the-counter to use for this.  You should be seen by her primary care doctor within 5 days.  We are glad that you improved with fluids and medication.  Should you have worsening of pain, changes in vision, numbness or tingling in your extremities, fevers, your job becomes tired when you eat, you should be seen.  Your primary care doctor will give you the results of your blood work.

## 2018-12-27 ENCOUNTER — Other Ambulatory Visit: Payer: Self-pay | Admitting: Internal Medicine

## 2018-12-27 DIAGNOSIS — Z1231 Encounter for screening mammogram for malignant neoplasm of breast: Secondary | ICD-10-CM
